# Patient Record
Sex: Female | Born: 1970 | Race: Black or African American | Hispanic: No | Marital: Single
Health system: Southern US, Community
[De-identification: ages and names within clinical notes are randomized; demographics above are authoritative.]

---

## 2003-04-03 ENCOUNTER — Emergency Department (HOSPITAL_COMMUNITY): Admission: EM | Admit: 2003-04-03 | Discharge: 2003-04-03 | Payer: Self-pay | Admitting: Emergency Medicine

## 2003-04-03 ENCOUNTER — Encounter: Payer: Self-pay | Admitting: Emergency Medicine

## 2003-06-11 ENCOUNTER — Ambulatory Visit (HOSPITAL_COMMUNITY): Admission: RE | Admit: 2003-06-11 | Discharge: 2003-06-11 | Payer: Self-pay | Admitting: Gastroenterology

## 2003-06-12 ENCOUNTER — Ambulatory Visit (HOSPITAL_COMMUNITY): Admission: RE | Admit: 2003-06-12 | Discharge: 2003-06-12 | Payer: Self-pay | Admitting: Gastroenterology

## 2003-06-17 ENCOUNTER — Ambulatory Visit (HOSPITAL_COMMUNITY): Admission: RE | Admit: 2003-06-17 | Discharge: 2003-06-17 | Payer: Self-pay | Admitting: Gastroenterology

## 2003-08-29 ENCOUNTER — Other Ambulatory Visit: Admission: RE | Admit: 2003-08-29 | Discharge: 2003-08-29 | Payer: Self-pay | Admitting: Family Medicine

## 2010-08-08 ENCOUNTER — Encounter: Payer: Self-pay | Admitting: Gastroenterology

## 2019-01-31 ENCOUNTER — Other Ambulatory Visit: Payer: Self-pay

## 2019-01-31 DIAGNOSIS — Z20822 Contact with and (suspected) exposure to covid-19: Secondary | ICD-10-CM

## 2019-02-04 LAB — NOVEL CORONAVIRUS, NAA: SARS-CoV-2, NAA: NOT DETECTED

## 2019-07-26 DIAGNOSIS — H5203 Hypermetropia, bilateral: Secondary | ICD-10-CM | POA: Diagnosis not present

## 2019-07-26 DIAGNOSIS — H524 Presbyopia: Secondary | ICD-10-CM | POA: Diagnosis not present

## 2019-07-26 DIAGNOSIS — H52209 Unspecified astigmatism, unspecified eye: Secondary | ICD-10-CM | POA: Diagnosis not present

## 2019-08-02 DIAGNOSIS — Z1211 Encounter for screening for malignant neoplasm of colon: Secondary | ICD-10-CM | POA: Diagnosis not present

## 2019-08-02 DIAGNOSIS — Z1231 Encounter for screening mammogram for malignant neoplasm of breast: Secondary | ICD-10-CM | POA: Diagnosis not present

## 2019-08-02 DIAGNOSIS — Z0000 Encounter for general adult medical examination without abnormal findings: Secondary | ICD-10-CM | POA: Diagnosis not present

## 2019-08-02 DIAGNOSIS — R69 Illness, unspecified: Secondary | ICD-10-CM | POA: Diagnosis not present

## 2019-08-02 DIAGNOSIS — I10 Essential (primary) hypertension: Secondary | ICD-10-CM | POA: Diagnosis not present

## 2019-08-02 DIAGNOSIS — R7309 Other abnormal glucose: Secondary | ICD-10-CM | POA: Diagnosis not present

## 2019-08-02 DIAGNOSIS — Z9889 Other specified postprocedural states: Secondary | ICD-10-CM | POA: Diagnosis not present

## 2019-08-04 ENCOUNTER — Other Ambulatory Visit: Payer: Self-pay

## 2019-08-04 ENCOUNTER — Encounter (HOSPITAL_COMMUNITY): Payer: Self-pay

## 2019-08-04 DIAGNOSIS — I959 Hypotension, unspecified: Secondary | ICD-10-CM | POA: Diagnosis not present

## 2019-08-04 DIAGNOSIS — I10 Essential (primary) hypertension: Secondary | ICD-10-CM | POA: Diagnosis not present

## 2019-08-04 DIAGNOSIS — E861 Hypovolemia: Secondary | ICD-10-CM | POA: Diagnosis not present

## 2019-08-04 DIAGNOSIS — R0602 Shortness of breath: Secondary | ICD-10-CM | POA: Insufficient documentation

## 2019-08-04 DIAGNOSIS — I4510 Unspecified right bundle-branch block: Secondary | ICD-10-CM | POA: Diagnosis not present

## 2019-08-04 DIAGNOSIS — R55 Syncope and collapse: Secondary | ICD-10-CM | POA: Insufficient documentation

## 2019-08-04 DIAGNOSIS — R197 Diarrhea, unspecified: Secondary | ICD-10-CM | POA: Diagnosis not present

## 2019-08-04 DIAGNOSIS — I491 Atrial premature depolarization: Secondary | ICD-10-CM | POA: Diagnosis not present

## 2019-08-04 DIAGNOSIS — R000 Tachycardia, unspecified: Secondary | ICD-10-CM | POA: Diagnosis not present

## 2019-08-04 DIAGNOSIS — Z20828 Contact with and (suspected) exposure to other viral communicable diseases: Secondary | ICD-10-CM | POA: Diagnosis not present

## 2019-08-04 DIAGNOSIS — Z20822 Contact with and (suspected) exposure to covid-19: Secondary | ICD-10-CM | POA: Insufficient documentation

## 2019-08-04 LAB — CBC
HCT: 42.7 % (ref 36.0–46.0)
Hemoglobin: 14.1 g/dL (ref 12.0–15.0)
MCH: 29.4 pg (ref 26.0–34.0)
MCHC: 33 g/dL (ref 30.0–36.0)
MCV: 89.1 fL (ref 80.0–100.0)
Platelets: 367 10*3/uL (ref 150–400)
RBC: 4.79 MIL/uL (ref 3.87–5.11)
RDW: 13 % (ref 11.5–15.5)
WBC: 18.2 10*3/uL — ABNORMAL HIGH (ref 4.0–10.5)
nRBC: 0 % (ref 0.0–0.2)

## 2019-08-04 LAB — I-STAT BETA HCG BLOOD, ED (MC, WL, AP ONLY): I-stat hCG, quantitative: <5 m[IU]/mL (ref <5)

## 2019-08-04 LAB — BASIC METABOLIC PANEL
Anion gap: 10 (ref 5–15)
BUN: 62 mg/dL — ABNORMAL HIGH (ref 6–20)
CO2: 22 mmol/L (ref 22–32)
Calcium: 8.8 mg/dL — ABNORMAL LOW (ref 8.9–10.3)
Chloride: 106 mmol/L (ref 98–111)
Creatinine, Ser: 1.09 mg/dL — ABNORMAL HIGH (ref 0.44–1.00)
GFR calc Af Amer: >60 mL/min (ref >60)
GFR calc non Af Amer: 59 mL/min — ABNORMAL LOW (ref >60)
Glucose, Bld: 173 mg/dL — ABNORMAL HIGH (ref 70–99)
Potassium: 3.7 mmol/L (ref 3.5–5.1)
Sodium: 138 mmol/L (ref 135–145)

## 2019-08-04 MED ORDER — SODIUM CHLORIDE 0.9% FLUSH
3.0000 mL | Freq: Once | INTRAVENOUS | Status: AC
  Administered 2019-08-05: 02:00:00 3 mL via INTRAVENOUS

## 2019-08-04 NOTE — ED Triage Notes (Signed)
Pt BIB [facility] from home after a witnessed syncopal episode. Pt was incontinent of urine and stool. She is complaining of "feeling tired." Hx of MR. Given 464mL en route.

## 2019-08-05 ENCOUNTER — Emergency Department (HOSPITAL_COMMUNITY): Payer: Medicare HMO

## 2019-08-05 ENCOUNTER — Emergency Department (HOSPITAL_COMMUNITY)
Admission: EM | Admit: 2019-08-05 | Discharge: 2019-08-05 | Disposition: A | Discharge status: Home / Self Care | Payer: Medicare HMO | Attending: Emergency Medicine | Admitting: Emergency Medicine

## 2019-08-05 DIAGNOSIS — R55 Syncope and collapse: Secondary | ICD-10-CM | POA: Diagnosis not present

## 2019-08-05 DIAGNOSIS — I959 Hypotension, unspecified: Secondary | ICD-10-CM | POA: Diagnosis not present

## 2019-08-05 DIAGNOSIS — R197 Diarrhea, unspecified: Secondary | ICD-10-CM

## 2019-08-05 DIAGNOSIS — R0602 Shortness of breath: Secondary | ICD-10-CM | POA: Diagnosis not present

## 2019-08-05 DIAGNOSIS — I9589 Other hypotension: Secondary | ICD-10-CM

## 2019-08-05 DIAGNOSIS — E861 Hypovolemia: Secondary | ICD-10-CM | POA: Diagnosis not present

## 2019-08-05 LAB — URINALYSIS, ROUTINE W REFLEX MICROSCOPIC
Bilirubin Urine: NEGATIVE
Glucose, UA: NEGATIVE mg/dL
Hgb urine dipstick: NEGATIVE
Ketones, ur: 5 mg/dL — AB
Nitrite: NEGATIVE
Protein, ur: NEGATIVE mg/dL
Specific Gravity, Urine: 1.021 (ref 1.005–1.030)
pH: 5 (ref 5.0–8.0)

## 2019-08-05 LAB — LACTIC ACID, PLASMA
Lactic Acid, Venous: 2.2 mmol/L (ref 0.5–1.9)
Lactic Acid, Venous: 1.5 mmol/L (ref 0.5–1.9)

## 2019-08-05 LAB — POC SARS CORONAVIRUS 2 AG -  ED: SARS Coronavirus 2 Ag: NEGATIVE

## 2019-08-05 LAB — CBG MONITORING, ED: Glucose-Capillary: 139 mg/dL — ABNORMAL HIGH (ref 70–99)

## 2019-08-05 LAB — TROPONIN I (HIGH SENSITIVITY)
Troponin I (High Sensitivity): 184 ng/L (ref <18)
Troponin I (High Sensitivity): 188 ng/L (ref <18)

## 2019-08-05 LAB — SARS CORONAVIRUS 2 (TAT 6-24 HRS): SARS Coronavirus 2: NEGATIVE

## 2019-08-05 MED ORDER — IOHEXOL 350 MG/ML SOLN
80.0000 mL | Freq: Once | INTRAVENOUS | Status: AC | PRN
  Administered 2019-08-05: 80 mL via INTRAVENOUS

## 2019-08-05 MED ORDER — SODIUM CHLORIDE (PF) 0.9 % IJ SOLN
INTRAMUSCULAR | Status: AC
  Filled 2019-08-05: qty 50

## 2019-08-05 MED ORDER — ONDANSETRON 8 MG PO TBDP: ORAL_TABLET | ORAL | 0 refills | Status: AC

## 2019-08-05 MED ORDER — SODIUM CHLORIDE 0.9 % IV BOLUS
1000.0000 mL | Freq: Once | INTRAVENOUS | Status: AC
  Administered 2019-08-05: 05:00:00 1000 mL via INTRAVENOUS

## 2019-08-05 MED ORDER — SODIUM CHLORIDE 0.9 % IV BOLUS
1000.0000 mL | Freq: Once | INTRAVENOUS | Status: AC
  Administered 2019-08-05: 08:00:00 1000 mL via INTRAVENOUS

## 2019-08-05 MED ORDER — SODIUM CHLORIDE 0.9 % IV BOLUS
1000.0000 mL | Freq: Once | INTRAVENOUS | Status: AC
  Administered 2019-08-05: 1000 mL via INTRAVENOUS

## 2019-08-05 MED ORDER — ACETAMINOPHEN 325 MG PO TABS
650.0000 mg | ORAL_TABLET | Freq: Once | ORAL | Status: AC
  Administered 2019-08-05: 650 mg via ORAL
  Filled 2019-08-05: qty 2

## 2019-08-05 NOTE — ED Notes (Signed)
Patient ambulated to RR and back, steady gait with no assistance needed. Patient stated she felt weak/fatigued. HR during ambulation went to 125, now 110 resting in bed.

## 2019-08-05 NOTE — ED Notes (Signed)
Patient aware urine sample is needed.  

## 2019-08-05 NOTE — ED Provider Notes (Signed)
[facility] DEPT Provider Note   CSN: [id number] Arrival date & time: 08/04/19  2127   Time seen 2:14 AM  History Chief Complaint  Patient presents with   Loss of Consciousness   Level 5 caveat for mental retardation  Sara Mccarthy is a 49 y.o. female.  HPI   Patient reports she has been having nausea and vomiting daily since the 14th.  She states today at dinnertime she could not go eat because she got dizzy.  She denies chest pain but did she will feel short of breath, she denies cough or sore throat.  She denies diarrhea.  She states she has had some upper abdominal pain but not now.  She states her mouth feels dry.  Per EMS patient had a syncopal episode and was incontinent of urine and stool.  She was given 450 cc of fluid in route by EMS.  PCP Patient, No Pcp Per   History reviewed. No pertinent past medical history.  There are no problems to display for this patient.   The histories are not reviewed yet. Please review them in the "History" navigator section and refresh this [place].   OB History   No obstetric history on file.     History reviewed. No pertinent family history.  Social History   Tobacco Use   Smoking status: Not on file  Substance Use Topics   Alcohol use: Not on file   Drug use: Not on file  Lives at home  Home Medications Prior to Admission medications   Medication Sig Start Date End Date Taking? Authorizing Provider  amLODipine (NORVASC) 5 MG tablet Take 5 mg by mouth daily. 08/03/19  Yes [provider]  losartan-hydrochlorothiazide (HYZAAR) 50-12.5 MG tablet Take 1 tablet by mouth daily. 08/02/19  Yes [provider]    Allergies    Patient has no known allergies.  Review of Systems   Review of Systems  Unable to perform ROS: Other    Physical Exam Updated Vital Signs BP (!) 125/91 (BP Location: Right Arm)    Pulse (!) 113    Temp 98.1 F (36.7 C) (Oral)    Resp 15    Ht  5' 5"  (1.651 m)    Wt 45.8 kg    SpO2 100%    BMI 16.81 kg/m   Physical Exam Vitals and nursing note reviewed.  Constitutional:      Appearance: Normal appearance. She is normal weight.  HENT:     Head: Normocephalic and atraumatic.     Right Ear: External ear normal.     Left Ear: External ear normal.  Eyes:     Extraocular Movements: Extraocular movements intact.     Conjunctiva/sclera: Conjunctivae normal.     Pupils: Pupils are equal, round, and reactive to light.  Cardiovascular:     Rate and Rhythm: Regular rhythm. Tachycardia present.  Pulmonary:     Effort: Pulmonary effort is normal. No respiratory distress.     Breath sounds: Normal breath sounds.  Abdominal:     General: Abdomen is flat. Bowel sounds are normal.     Palpations: Abdomen is soft.  Musculoskeletal:        General: No swelling or deformity. Normal range of motion.     Cervical back: Normal range of motion.  Skin:    General: Skin is warm and dry.  Neurological:     General: No focal deficit present.     Mental Status: She is alert. Mental  status is at baseline.     Cranial Nerves: No cranial nerve deficit.  Psychiatric:        Mood and Affect: Mood normal.        Behavior: Behavior normal.        Thought Content: Thought content normal.     ED Results / Procedures / Treatments   Labs (all labs ordered are listed, but only abnormal results are displayed) Results for orders placed or performed during the hospital encounter of 61/44/31  Basic metabolic panel  Result Value Ref Range   Sodium 138 135 - 145 mmol/L   Potassium 3.7 3.5 - 5.1 mmol/L   Chloride 106 98 - 111 mmol/L   CO2 22 22 - 32 mmol/L   Glucose, Bld 173 (H) 70 - 99 mg/dL   BUN 62 (H) 6 - 20 mg/dL   Creatinine, Ser 1.09 (H) 0.44 - 1.00 mg/dL   Calcium 8.8 (L) 8.9 - 10.3 mg/dL   GFR calc non Af Amer 59 (L) >60 mL/min   GFR calc Af Amer >60 >60 mL/min   Anion gap 10 5 - 15  CBC  Result Value Ref Range   WBC 18.2 (H) 4.0 - 10.5  K/uL   RBC 4.79 3.87 - 5.11 MIL/uL   Hemoglobin 14.1 12.0 - 15.0 g/dL   HCT 42.7 36.0 - 46.0 %   MCV 89.1 80.0 - 100.0 fL   MCH 29.4 26.0 - 34.0 pg   MCHC 33.0 30.0 - 36.0 g/dL   RDW 13.0 11.5 - 15.5 %   Platelets 367 150 - 400 K/uL   nRBC 0.0 0.0 - 0.2 %  Urinalysis, Routine w reflex microscopic  Result Value Ref Range   Color, Urine YELLOW YELLOW   APPearance CLEAR CLEAR   Specific Gravity, Urine 1.021 1.005 - 1.030   pH 5.0 5.0 - 8.0   Glucose, UA NEGATIVE NEGATIVE mg/dL   Hgb urine dipstick NEGATIVE NEGATIVE   Bilirubin Urine NEGATIVE NEGATIVE   Ketones, ur 5 (A) NEGATIVE mg/dL   Protein, ur NEGATIVE NEGATIVE mg/dL   Nitrite NEGATIVE NEGATIVE   Leukocytes,Ua MODERATE (A) NEGATIVE   RBC / HPF 0-5 0 - 5 RBC/hpf   WBC, UA 0-5 0 - 5 WBC/hpf   Bacteria, UA RARE (A) NONE SEEN   Squamous Epithelial / LPF 0-5 0 - 5   Mucus PRESENT    Hyaline Casts, UA PRESENT   Lactic acid, plasma  Result Value Ref Range   Lactic Acid, Venous 2.2 (HH) 0.5 - 1.9 mmol/L  Lactic acid, plasma  Result Value Ref Range   Lactic Acid, Venous 1.5 0.5 - 1.9 mmol/L  CBG monitoring, ED  Result Value Ref Range   Glucose-Capillary 139 (H) 70 - 99 mg/dL  I-Stat beta hCG blood, ED  Result Value Ref Range   I-stat hCG, quantitative <5.0 <5 mIU/mL   Comment 3          POC SARS Coronavirus 2 Ag-ED - Nasal Swab (BD Veritor Kit)  Result Value Ref Range   SARS Coronavirus 2 Ag NEGATIVE NEGATIVE  Troponin I (High Sensitivity)  Result Value Ref Range   Troponin I (High Sensitivity) 184 (HH) <18 ng/L  Troponin I (High Sensitivity)  Result Value Ref Range   Troponin I (High Sensitivity) 188 (HH) <18 ng/L   Laboratory interpretation all normal except very elevated BUN consistent with dehydration, leukocytosis, mildly elevated lactic acid which improved after IV fluids, elevated troponin but delta troponin is negative  EKG EKG Interpretation  Date/Time:  Saturday August 04 2019 21:53:13 EST Ventricular  Rate:  125 PR Interval:    QRS Duration: 116 QT Interval:  324 QTC Calculation: 468 R Axis:   -25 Text Interpretation: Sinus tachycardia Premature ventricular complexes Right bundle branch block Low voltage, precordial leads Artifact in lead(s) I II aVR No old tracing to compare Confirmed by Rolland Porter [phone]) on 08/05/2019 2:17:20 AM  # 2 done 2 minutes later  EKG Interpretation  Date/Time:  Saturday August 04 2019 21:55:37 EST Ventricular Rate:  121 PR Interval:    QRS Duration: 114 QT Interval:  336 QTC Calculation: 477 R Axis:   -75 Text Interpretation: Sinus tachycardia Ventricular premature complex Incomplete right bundle branch block Inferior infarct, old Confirmed by Rolland Porter [phone]) on 08/05/2019 2:18:13 AM        Radiology No results found.  Procedures Procedures (including critical care time)  Medications Ordered in ED Medications  sodium chloride flush (NS) 0.9 % injection 3 mL (3 mLs Intravenous Given 08/05/19 0148)  sodium chloride 0.9 % bolus 1,000 mL (0 mLs Intravenous Stopped 08/05/19 0332)  sodium chloride 0.9 % bolus 1,000 mL (0 mLs Intravenous Stopped 08/05/19 0614)  sodium chloride 0.9 % bolus 1,000 mL (1,000 mLs Intravenous New Bag/Given 08/05/19 0527)  acetaminophen (TYLENOL) tablet 650 mg (650 mg Oral Given 08/05/19 0612)  sodium chloride 0.9 % bolus 1,000 mL (0 mLs Intravenous Stopped 08/05/19 0741)    ED Course  I have reviewed the triage vital signs and the nursing notes.  Pertinent labs & imaging results that were available during my care of the patient were reviewed by me and considered in my medical decision making (see chart for details).    MDM Rules/Calculators/A&P                       At the time of my exam patient had just ambulated to the bathroom to urinate.  Her heart rate on the monitor was 150 and regular, as I talked to her it did come down to 146.  Her pulse ox was 100% and her blood pressure is now 134/112.  When she first  arrived in the ED her blood pressure was 88/74 with heart rate 127.  Patient appears to be dehydrated.  At first I thought she was in SVT however the rate did slow down into the 140s so I elected to give her the liter bolus that I had ordered earlier that she had not received yet and see if her heart rate improves with fluids.  Recheck at 4 AM patient's heart rate is now in the 120 range.  Patient states she is feeling better.  Her initial lactic acid is mildly elevated but her initial troponin is positive.  5:25 AM I spoke to Oluwadarasimi Favor [phone] who states she is her sister-in-law and one of her caregivers.  She was updated on her status.  Patient's initial troponin was elevated however the delta troponin is unchanged.  I am going to discuss her with cardiology.  06:43 AM Pt discussed with Dr Marletta Lor, Cardiology, feels if patient heart rate improved with fluids and her blood pressure improves of fluids if she is discharged home she should have a cardiology follow-up to get a echocardiogram.  Recheck at 7:30 AM patient remains tachycardic at 119.  She states she feels much improved.  Her blood pressure is in the 120 range.  She has finished her 3 L of fluid.  She will get the fourth liter and I am going to see if she will drink fluids.  Patient's lactic acidosis has improved with the IV fluids.  1:45 AM patient turned over to Dr. Stark Jock at change of shift.       Final Clinical Impression(s) / ED Diagnoses Final diagnoses:  Syncope, unspecified syncope type  Diarrhea, unspecified type  Hypotension due to hypovolemia    Rx / DC Orders   Disposition pending  Rolland Porter, MD, Barbette Or, MD 08/05/19 [phone]

## 2019-08-05 NOTE — ED Notes (Signed)
Patient ambulated to RR and back without difficulty.  HR started at 115 and elevated to 125, quickly going back to 115 after ambulation. Patient voiced no complaints, no weakness or fatigue.

## 2019-08-05 NOTE — ED Notes (Signed)
Date and time results received: 08/05/19 0344 (use smartphrase ".now" to insert current time)  Test: Troponin, Lactic Acid Critical Value: 184, 2.2  Name of Provider Notified: Dr.I Tomi Bamberger  Orders Received? Or Actions Taken?:

## 2019-08-05 NOTE — ED Notes (Signed)
Legal guardian Rollene Fare aware that patient is here in the ED

## 2019-08-05 NOTE — ED Notes (Signed)
Sister notified of patient's status, summary given and notified of DC, they will call when they are here to pick her up.

## 2019-08-05 NOTE — ED Provider Notes (Signed)
Patient is a 49 year old female presenting with complaints of weakness and dizziness.  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  She also has a history of MR/cognitive delay.  Care signed out to me awaiting administration of IV fluids.  She has received 3 L of normal saline and appears to be feeling somewhat better.  Her cardiac enzymes were mildly elevated.  Dr. Tomi Bamberger discussed this with cardiology who felt as though no emergent intervention was necessary.  I reexamined the patient and am concerned about the possibility of pulmonary embolism.  A PE study was obtained and was negative.  Patient has been tachycardic throughout her emergency department course.  Whether this tachycardia is related to dehydration or her circulatory physiology related to her prior heart surgery I am uncertain.  She is ambulatory in the hallway.  She occasionally feels dizzy, but overall feels improved from time of presentation.  At this point, I feel as though she is appropriate for discharge.  She will be given medication for nausea and is to return as needed for any problems.   Veryl Speak, MD 08/05/19 1011

## 2019-08-05 NOTE — Discharge Instructions (Addendum)
Begin taking Zofran as prescribed as needed for nausea.  Drink plenty of fluids and get plenty of rest.  Return to the emergency department if your symptoms significantly worsen or change.

## 2019-09-14 DIAGNOSIS — K5900 Constipation, unspecified: Secondary | ICD-10-CM | POA: Diagnosis not present

## 2019-09-14 DIAGNOSIS — Z8249 Family history of ischemic heart disease and other diseases of the circulatory system: Secondary | ICD-10-CM | POA: Diagnosis not present

## 2019-09-14 DIAGNOSIS — Z809 Family history of malignant neoplasm, unspecified: Secondary | ICD-10-CM | POA: Diagnosis not present

## 2019-09-14 DIAGNOSIS — R69 Illness, unspecified: Secondary | ICD-10-CM | POA: Diagnosis not present

## 2019-09-14 DIAGNOSIS — Z833 Family history of diabetes mellitus: Secondary | ICD-10-CM | POA: Diagnosis not present

## 2019-10-05 DIAGNOSIS — Z1231 Encounter for screening mammogram for malignant neoplasm of breast: Secondary | ICD-10-CM | POA: Diagnosis not present

## 2019-11-30 DIAGNOSIS — Z1329 Encounter for screening for other suspected endocrine disorder: Secondary | ICD-10-CM | POA: Diagnosis not present

## 2019-11-30 DIAGNOSIS — E7800 Pure hypercholesterolemia, unspecified: Secondary | ICD-10-CM | POA: Diagnosis not present

## 2019-11-30 DIAGNOSIS — Z0000 Encounter for general adult medical examination without abnormal findings: Secondary | ICD-10-CM | POA: Diagnosis not present

## 2019-11-30 DIAGNOSIS — R69 Illness, unspecified: Secondary | ICD-10-CM | POA: Diagnosis not present

## 2019-11-30 DIAGNOSIS — Z130 Encounter for screening for diseases of the blood and blood-forming organs and certain disorders involving the immune mechanism: Secondary | ICD-10-CM | POA: Diagnosis not present

## 2019-11-30 DIAGNOSIS — Z13220 Encounter for screening for lipoid disorders: Secondary | ICD-10-CM | POA: Diagnosis not present

## 2019-11-30 DIAGNOSIS — Z23 Encounter for immunization: Secondary | ICD-10-CM | POA: Diagnosis not present

## 2019-11-30 DIAGNOSIS — Z1389 Encounter for screening for other disorder: Secondary | ICD-10-CM | POA: Diagnosis not present

## 2019-11-30 DIAGNOSIS — I10 Essential (primary) hypertension: Secondary | ICD-10-CM | POA: Diagnosis not present

## 2019-12-27 DIAGNOSIS — Z1211 Encounter for screening for malignant neoplasm of colon: Secondary | ICD-10-CM | POA: Diagnosis not present

## 2019-12-27 DIAGNOSIS — Z538 Procedure and treatment not carried out for other reasons: Secondary | ICD-10-CM | POA: Diagnosis not present

## 2019-12-27 DIAGNOSIS — Z800 Family history of malignant neoplasm of digestive organs: Secondary | ICD-10-CM | POA: Diagnosis not present

## 2019-12-28 DIAGNOSIS — Z800 Family history of malignant neoplasm of digestive organs: Secondary | ICD-10-CM | POA: Diagnosis not present

## 2019-12-28 DIAGNOSIS — K648 Other hemorrhoids: Secondary | ICD-10-CM | POA: Diagnosis not present

## 2019-12-28 DIAGNOSIS — D125 Benign neoplasm of sigmoid colon: Secondary | ICD-10-CM | POA: Diagnosis not present

## 2019-12-28 DIAGNOSIS — D120 Benign neoplasm of cecum: Secondary | ICD-10-CM | POA: Diagnosis not present

## 2019-12-28 DIAGNOSIS — Z1211 Encounter for screening for malignant neoplasm of colon: Secondary | ICD-10-CM | POA: Diagnosis not present

## 2019-12-28 DIAGNOSIS — K635 Polyp of colon: Secondary | ICD-10-CM | POA: Diagnosis not present

## 2020-06-02 DIAGNOSIS — Z23 Encounter for immunization: Secondary | ICD-10-CM | POA: Diagnosis not present

## 2020-06-02 DIAGNOSIS — I10 Essential (primary) hypertension: Secondary | ICD-10-CM | POA: Diagnosis not present

## 2020-06-02 DIAGNOSIS — R69 Illness, unspecified: Secondary | ICD-10-CM | POA: Diagnosis not present

## 2020-06-20 IMAGING — CT CT ANGIO CHEST
2 of 6 series · 18 of 36 positions shown · IV contrast (omnipaque)
Comparison: None.

CLINICAL DATA: Shortness of breath, weakness

EXAM:
CT ANGIOGRAPHY CHEST WITH CONTRAST
TECHNIQUE: Multidetector CT imaging of the chest was performed using the
standard protocol during bolus administration of intravenous
contrast. Multiplanar CT image reconstructions and MIPs were
obtained to evaluate the vascular anatomy.
CONTRAST:  80mL OMNIPAQUE IOHEXOL 350 MG/ML SOLN

[Series 5: thins · axial · 0.69mm/px · z∈[-239,-7]mm · 17 of 262 slices shown]
[im 15/262  lung]
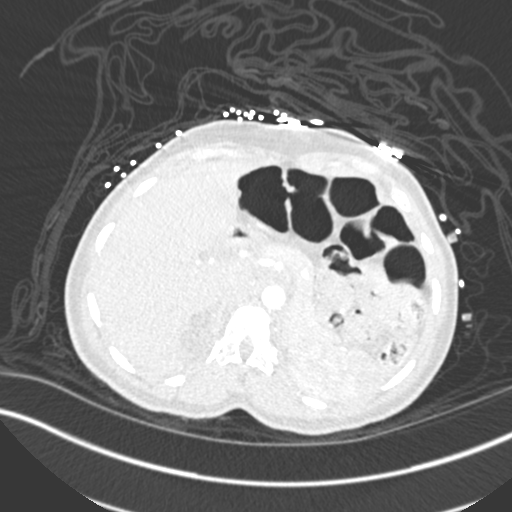
[im 30/262  mediastinal]
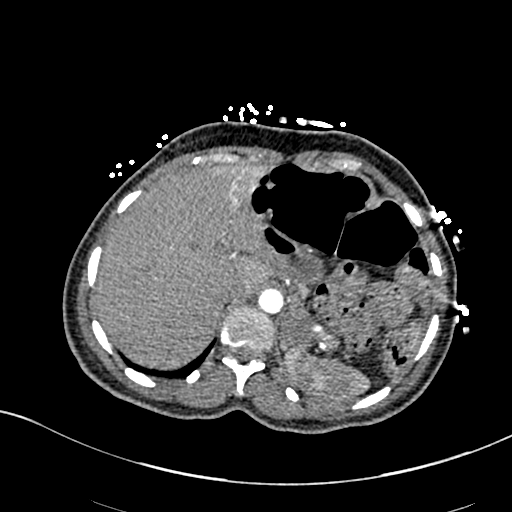
[im 44/262  lung]
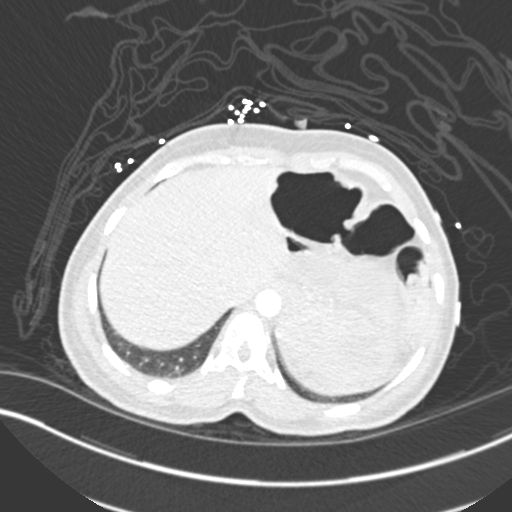
[im 59/262  mediastinal]
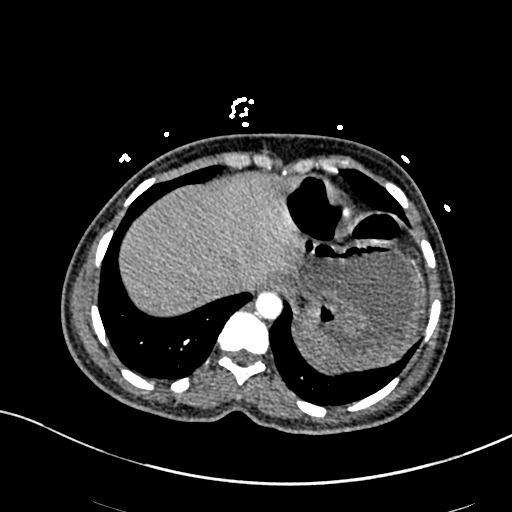
[im 73/262  lung]
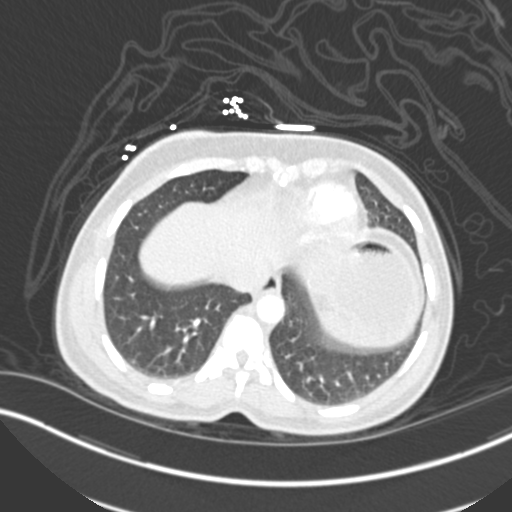
[im 88/262  mediastinal]
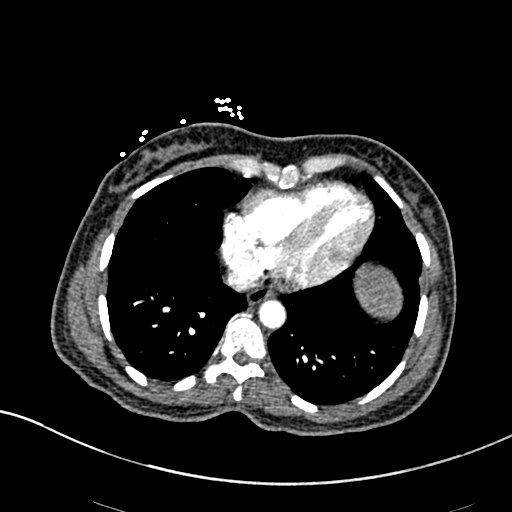
[im 102/262  lung]
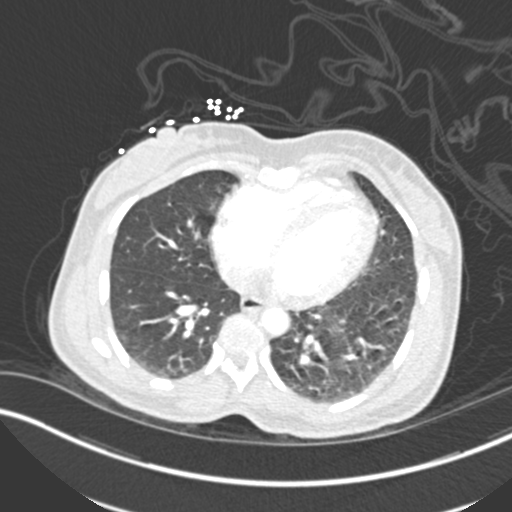
[im 117/262  mediastinal]
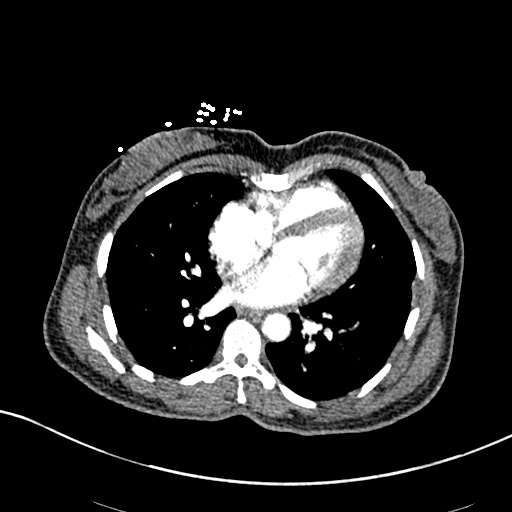
[im 131/262  lung]
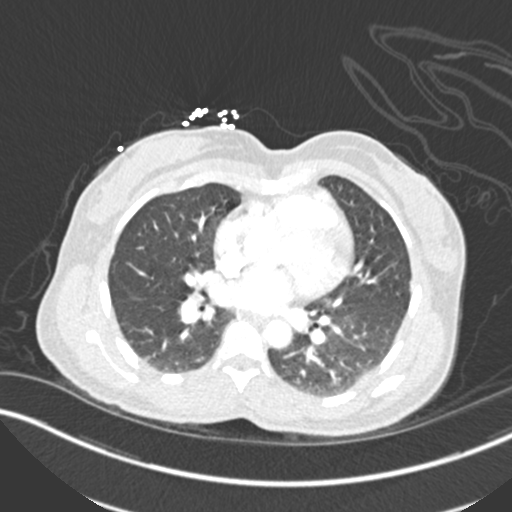
[im 146/262  mediastinal]
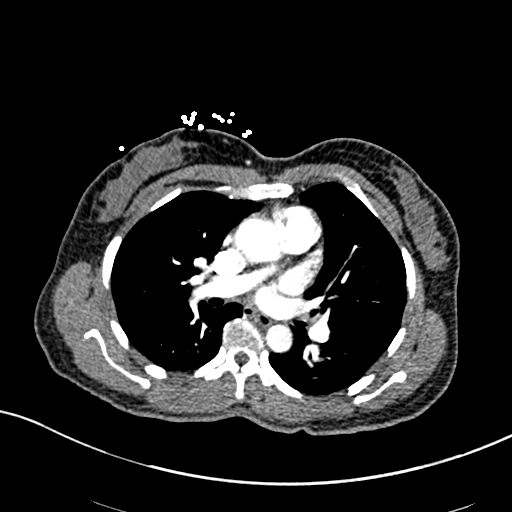
[im 160/262  lung]
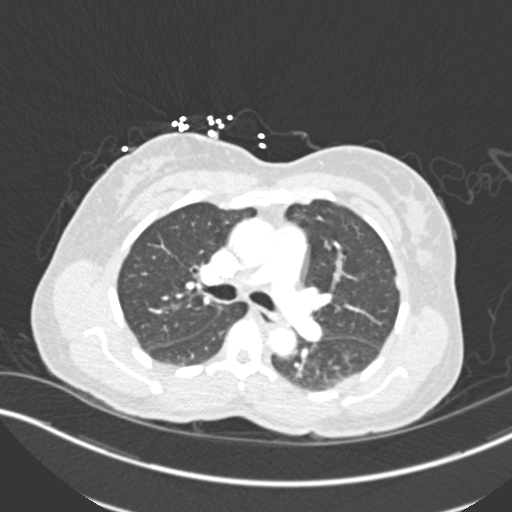
[im 175/262  mediastinal]
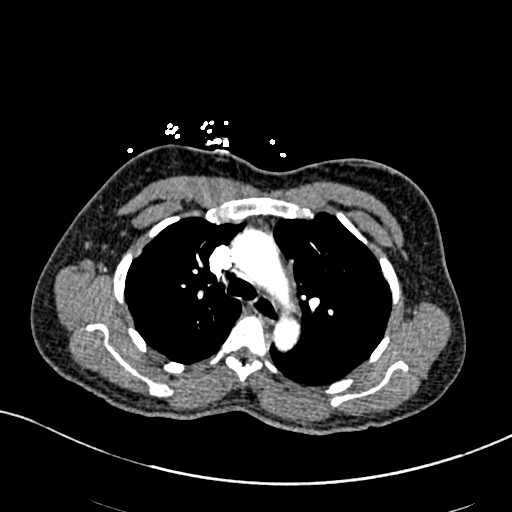
[im 189/262  lung]
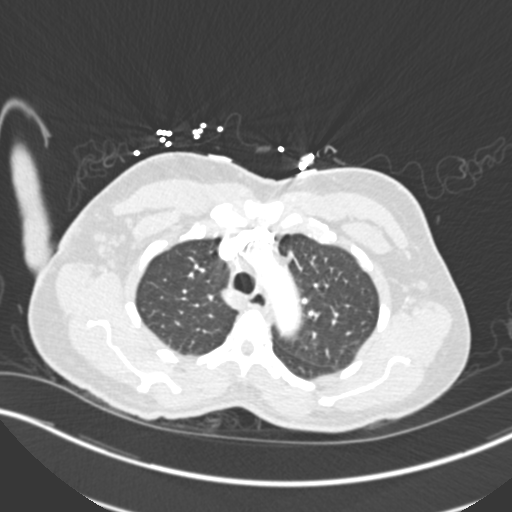
[im 204/262  mediastinal]
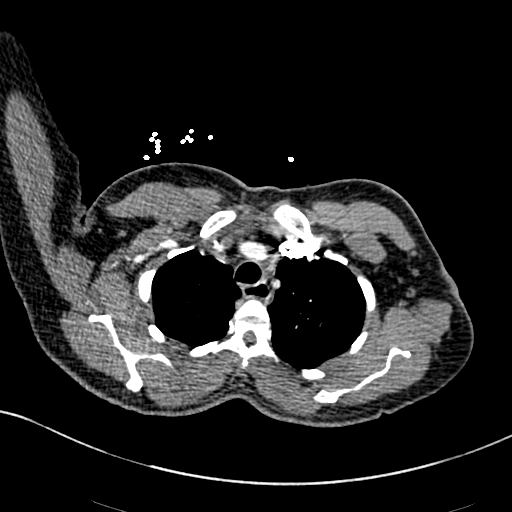
[im 218/262  lung]
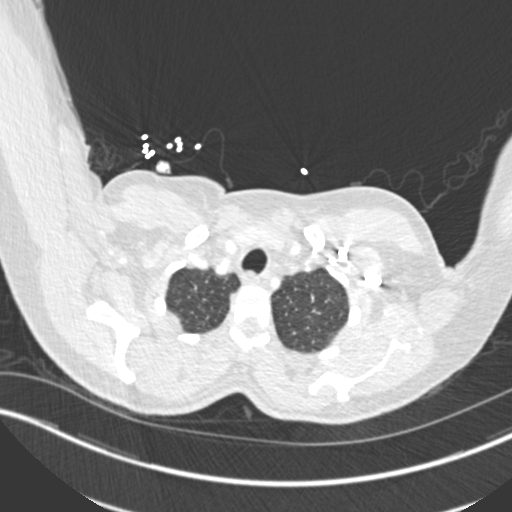
[im 233/262  mediastinal]
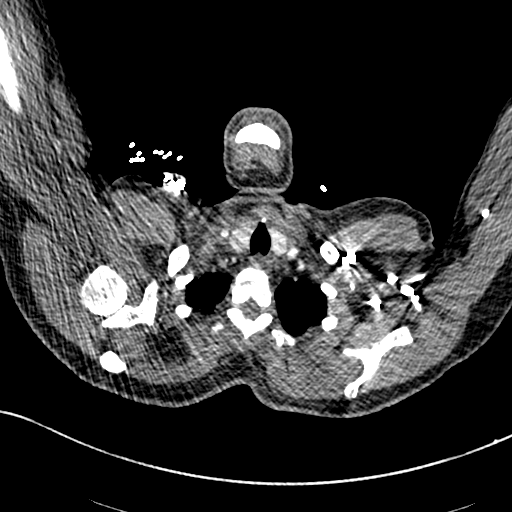
[im 247/262  lung]
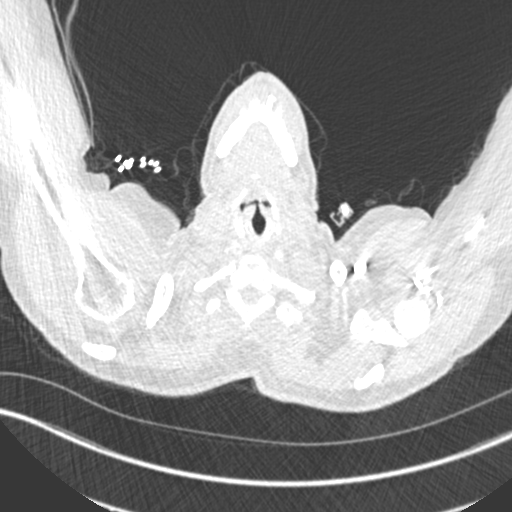

[Series 6: coronal mpr · coronal · 0.58mm/px · 1 of 104 slices shown]
[im 52/104  mediastinal]
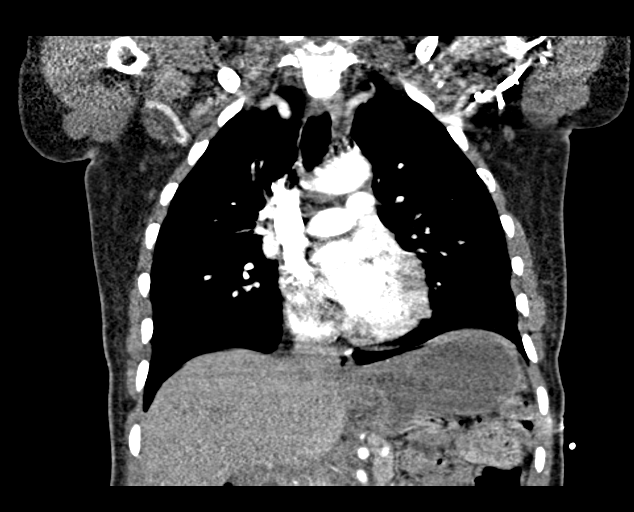

[18 of 36 positions shown; findings below may reference images not displayed]

FINDINGS: Cardiovascular: Satisfactory opacification of the pulmonary arteries
to the segmental level. No evidence of pulmonary embolism. Normal
heart size. No pericardial effusion. Thoracic aorta is
nonaneurysmal.

Mediastinum/Nodes: No enlarged mediastinal, hilar, or axillary lymph
nodes. Thyroid gland, trachea, and esophagus demonstrate no
significant findings.

Lungs/Pleura: Lungs are clear. No pleural effusion or pneumothorax.

Upper Abdomen: No acute abnormality.

Musculoskeletal: No chest wall abnormality. No acute or significant
osseous findings.

Review of the MIP images confirms the above findings.
IMPRESSION: 1. Negative for pulmonary embolism.
2. No acute intrathoracic findings.

## 2020-07-07 DIAGNOSIS — Z23 Encounter for immunization: Secondary | ICD-10-CM | POA: Diagnosis not present

## 2020-08-14 DIAGNOSIS — Z008 Encounter for other general examination: Secondary | ICD-10-CM | POA: Diagnosis not present

## 2020-08-14 DIAGNOSIS — Z8249 Family history of ischemic heart disease and other diseases of the circulatory system: Secondary | ICD-10-CM | POA: Diagnosis not present

## 2020-08-14 DIAGNOSIS — Z809 Family history of malignant neoplasm, unspecified: Secondary | ICD-10-CM | POA: Diagnosis not present

## 2020-08-14 DIAGNOSIS — I10 Essential (primary) hypertension: Secondary | ICD-10-CM | POA: Diagnosis not present

## 2020-08-15 DIAGNOSIS — Z23 Encounter for immunization: Secondary | ICD-10-CM | POA: Diagnosis not present

## 2020-10-08 DIAGNOSIS — Z1231 Encounter for screening mammogram for malignant neoplasm of breast: Secondary | ICD-10-CM | POA: Diagnosis not present

## 2020-12-01 DIAGNOSIS — I10 Essential (primary) hypertension: Secondary | ICD-10-CM | POA: Diagnosis not present

## 2020-12-01 DIAGNOSIS — R69 Illness, unspecified: Secondary | ICD-10-CM | POA: Diagnosis not present

## 2020-12-01 DIAGNOSIS — Z0000 Encounter for general adult medical examination without abnormal findings: Secondary | ICD-10-CM | POA: Diagnosis not present

## 2020-12-01 DIAGNOSIS — J301 Allergic rhinitis due to pollen: Secondary | ICD-10-CM | POA: Diagnosis not present
